# Patient Record
Sex: Male | Born: 1970 | Race: White | Hispanic: No | Marital: Married | Smoking: Never smoker
Health system: Southern US, Community
[De-identification: ages and names within clinical notes are randomized; demographics above are authoritative.]

## PROBLEM LIST (undated history)

## (undated) DIAGNOSIS — R6882 Decreased libido: Secondary | ICD-10-CM

## (undated) DIAGNOSIS — E559 Vitamin D deficiency, unspecified: Secondary | ICD-10-CM

## (undated) DIAGNOSIS — J309 Allergic rhinitis, unspecified: Secondary | ICD-10-CM

## (undated) DIAGNOSIS — R945 Abnormal results of liver function studies: Secondary | ICD-10-CM

## (undated) DIAGNOSIS — Z6829 Body mass index (BMI) 29.0-29.9, adult: Secondary | ICD-10-CM

## (undated) DIAGNOSIS — R7989 Other specified abnormal findings of blood chemistry: Secondary | ICD-10-CM

## (undated) DIAGNOSIS — L308 Other specified dermatitis: Secondary | ICD-10-CM

## (undated) DIAGNOSIS — E782 Mixed hyperlipidemia: Secondary | ICD-10-CM

## (undated) HISTORY — DX: Other specified dermatitis: L30.8

## (undated) HISTORY — PX: NO PAST SURGERIES: SHX2092

## (undated) HISTORY — DX: Abnormal results of liver function studies: R94.5

## (undated) HISTORY — DX: Body mass index (BMI) 29.0-29.9, adult: Z68.29

## (undated) HISTORY — DX: Vitamin D deficiency, unspecified: E55.9

## (undated) HISTORY — DX: Other specified abnormal findings of blood chemistry: R79.89

## (undated) HISTORY — DX: Mixed hyperlipidemia: E78.2

## (undated) HISTORY — DX: Decreased libido: R68.82

## (undated) HISTORY — DX: Allergic rhinitis, unspecified: J30.9

---

## 1998-12-18 ENCOUNTER — Emergency Department (HOSPITAL_COMMUNITY): Admission: EM | Admit: 1998-12-18 | Discharge: 1998-12-18 | Payer: Self-pay | Admitting: Emergency Medicine

## 1999-01-07 ENCOUNTER — Emergency Department (HOSPITAL_COMMUNITY): Admission: EM | Admit: 1999-01-07 | Discharge: 1999-01-07 | Payer: Self-pay | Admitting: Emergency Medicine

## 1999-11-27 ENCOUNTER — Emergency Department (HOSPITAL_COMMUNITY): Admission: EM | Admit: 1999-11-27 | Discharge: 1999-11-27 | Payer: Self-pay | Admitting: *Deleted

## 2000-03-26 ENCOUNTER — Emergency Department (HOSPITAL_COMMUNITY): Admission: EM | Admit: 2000-03-26 | Discharge: 2000-03-26 | Payer: Self-pay | Admitting: Emergency Medicine

## 2000-10-29 ENCOUNTER — Emergency Department (HOSPITAL_COMMUNITY): Admission: EM | Admit: 2000-10-29 | Discharge: 2000-10-30 | Payer: Self-pay | Admitting: Emergency Medicine

## 2000-10-30 ENCOUNTER — Emergency Department (HOSPITAL_COMMUNITY): Admission: EM | Admit: 2000-10-30 | Discharge: 2000-10-30 | Payer: Self-pay | Admitting: Emergency Medicine

## 2000-10-30 ENCOUNTER — Encounter: Payer: Self-pay | Admitting: Internal Medicine

## 2001-10-13 ENCOUNTER — Emergency Department (HOSPITAL_COMMUNITY): Admission: EM | Admit: 2001-10-13 | Discharge: 2001-10-14 | Payer: Self-pay | Admitting: Emergency Medicine

## 2001-10-14 ENCOUNTER — Encounter: Payer: Self-pay | Admitting: Emergency Medicine

## 2006-07-06 ENCOUNTER — Encounter: Admission: RE | Admit: 2006-07-06 | Discharge: 2006-07-06 | Payer: Self-pay | Admitting: Family Medicine

## 2006-08-10 ENCOUNTER — Emergency Department (HOSPITAL_COMMUNITY): Admission: EM | Admit: 2006-08-10 | Discharge: 2006-08-10 | Payer: Self-pay | Admitting: Family Medicine

## 2018-12-09 ENCOUNTER — Other Ambulatory Visit: Payer: Self-pay

## 2018-12-09 ENCOUNTER — Encounter: Payer: Self-pay | Admitting: Cardiology

## 2018-12-09 ENCOUNTER — Ambulatory Visit (INDEPENDENT_AMBULATORY_CARE_PROVIDER_SITE_OTHER): Payer: Commercial Managed Care - PPO | Admitting: Cardiology

## 2018-12-09 VITALS — BP 142/96 | HR 80 | Ht 67.0 in | Wt 202.0 lb

## 2018-12-09 DIAGNOSIS — R0789 Other chest pain: Secondary | ICD-10-CM | POA: Diagnosis not present

## 2018-12-09 DIAGNOSIS — E782 Mixed hyperlipidemia: Secondary | ICD-10-CM | POA: Diagnosis not present

## 2018-12-09 DIAGNOSIS — R079 Chest pain, unspecified: Secondary | ICD-10-CM | POA: Diagnosis not present

## 2018-12-09 DIAGNOSIS — I10 Essential (primary) hypertension: Secondary | ICD-10-CM | POA: Diagnosis not present

## 2018-12-09 NOTE — Progress Notes (Signed)
Cardiology Office Note:    Date:  12/09/2018   ID:  Jeremiah Cooley, DOB 1971-01-05, MRN [id number]  PCP:  Nicholos Johns, MD  Cardiologist:  Jenean Lindau, MD   Referring MD: Nicholos Johns, MD    ASSESSMENT:    1. Chest discomfort   2. Chest pain, unspecified type   3. Essential hypertension   4. Mixed dyslipidemia    PLAN:    In order of problems listed above:  1. Primary prevention stressed to the patient.  Importance of compliance with diet and medication stressed and he vocalized understanding. 2. His blood pressure is elevated.  He does not have a diagnosis of hypertension.  Lifestyle modification salt intake issues in the diet were discussed with him at extensive length and he promises to do better. 3. Mixed dyslipidemia: He is going to diet and get his lipids lower.  We will recheck them subsequently. 4. Chest discomfort: His symptoms are atypical for coronary etiology but in view of risk factors we will do a Lexiscan sestamibi.  Calcium score CT scoring will be done to assess risk stratification. 5. Patient will be seen in follow-up appointment in 2 months or earlier if the patient has any concerns    Medication Adjustments/Labs and Tests Ordered: Current medicines are reviewed at length with the patient today.  Concerns regarding medicines are outlined above.  No orders of the defined types were placed in this encounter.  No orders of the defined types were placed in this encounter.    History of Present Illness:    Jeremiah Cooley is a 48 y.o. male who is being seen today for the evaluation of chest discomfort at the request of Nicholos Johns, MD.  Patient is a pleasant 48 year old male.  He has past medical history of essential hypertension and mixed dyslipidemia.  He is on statins and elevated LFTs and so he stopped it.  He mentions to me that occasionally has chest discomfort and he talked about it to his primary care physician and therefore he was sent here for  evaluation.  The past week or so he has had no such problems.  His chest discomfort is not related to exertion.  No radiation or radiation to any part of the body.  At the time of my evaluation, the patient is alert awake oriented and in no distress.  Past Medical History:  Diagnosis Date   Abnormal liver function test    BMI 29.0-29.9,adult    Decreased libido    Excoriated eczema    Mixed hyperlipidemia    Rhinitis, allergic    Vitamin D deficiency     Past Surgical History:  Procedure Laterality Date   NO PAST SURGERIES      Current Medications: Current Meds  Medication Sig   aspirin EC 81 MG tablet Take 81 mg by mouth daily.   testosterone (ANDROGEL) 50 MG/5GM (1%) GEL Place 5 g onto the skin daily.   vitamin B-12 (CYANOCOBALAMIN) 100 MCG tablet Take 100 mcg by mouth daily.     Allergies:   Patient has no known allergies.   Social History   Socioeconomic History   Marital status: Married    Spouse name: Not on file   Number of children: Not on file   Years of education: Not on file   Highest education level: Not on file  Occupational History   Not on file  Social Needs   Financial resource strain: Not on file   Food insecurity  Worry: Not on file    Inability: Not on file   Transportation needs    Medical: Not on file    Non-medical: Not on file  Tobacco Use   Smoking status: Never Smoker   Smokeless tobacco: Never Used  Substance and Sexual Activity   Alcohol use: Not Currently   Drug use: Never   Sexual activity: Not on file  Lifestyle   Physical activity    Days per week: Not on file    Minutes per session: Not on file   Stress: Not on file  Relationships   Social connections    Talks on phone: Not on file    Gets together: Not on file    Attends religious service: Not on file    Active member of club or organization: Not on file    Attends meetings of clubs or organizations: Not on file    Relationship status: Not  on file  Other Topics Concern   Not on file  Social History Narrative   Not on file     Family History: The patient's family history includes Asthma in his father; Diabetes in his mother; Heart disease in his mother; Hypertension in his father and sister; Stroke in his father.  ROS:   Please see the history of present illness.    All other systems reviewed and are negative.  EKGs/Labs/Other Studies Reviewed:    The following studies were reviewed today: EKG reveals sinus rhythm and nonspecific ST-T changes.   Recent Labs: No results found for requested labs within last 8760 hours.  Recent Lipid Panel No results found for: CHOL, TRIG, HDL, CHOLHDL, VLDL, LDLCALC, LDLDIRECT  Physical Exam:    VS:  BP (!) 142/96 (BP Location: Right Arm, Patient Position: Sitting, Cuff Size: Normal)    Pulse 80    Ht 5\' 7"  (1.702 m)    Wt 202 lb (91.6 kg)    SpO2 98%    BMI 31.64 kg/m     Wt Readings from Last 3 Encounters:  12/09/18 202 lb (91.6 kg)     GEN: Patient is in no acute distress HEENT: Normal NECK: No JVD; No carotid bruits LYMPHATICS: No lymphadenopathy CARDIAC: S1 S2 regular, 2/6 systolic murmur at the apex. RESPIRATORY:  Clear to auscultation without rales, wheezing or rhonchi  ABDOMEN: Soft, non-tender, non-distended MUSCULOSKELETAL:  No edema; No deformity  SKIN: Warm and dry NEUROLOGIC:  Alert and oriented x 3 PSYCHIATRIC:  Normal affect    Signed, 13/02/20, MD  12/09/2018 4:37 PM    [facility]

## 2018-12-09 NOTE — Patient Instructions (Addendum)
Medication Instructions:  Your physician recommends that you continue on your current medications as directed. Please refer to the Current Medication list given to you today.   *If you need a refill on your cardiac medications before your next appointment, please call your pharmacy*  Lab Work: NONE If you have labs (blood work) drawn today and your tests are completely normal, you will receive your results only by: Jeremiah Cooley MyChart Message (if you have MyChart) OR . A paper copy in the mail If you have any lab test that is abnormal or we need to change your treatment, we will call you to review the results.  Testing/Procedures: You had an EKG performed today  Your physician has requested that you have a lexiscan myoview. For further information please visit [email or web address]. Please follow instruction sheet, as given.  Your physician has requested that you have a CT calcium score performed. THERE IS A $150 fee due upon time of service. You have been scheduled for Nov.20, 2020 at 1:30 PM at [address], [place], [place]    Follow-Up: At [facility], you and your health needs are our priority.  As part of our continuing mission to provide you with exceptional heart care, we have created designated Provider Care Teams.  These Care Teams include your primary Cardiologist (physician) and Advanced Practice Providers (APPs -  Physician Assistants and Nurse Practitioners) who all work together to provide you with the care you need, when you need it.  Your next appointment:   2 months  The format for your next appointment:   In Person  Provider:   Jyl Heinz, MD  Other Instructions Regadenoson injection What is this medicine? REGADENOSON is used to test the heart for coronary artery disease. It is used in patients who can not exercise for their stress test. This medicine may be used for other purposes; ask your health care provider or pharmacist if you have questions.  COMMON BRAND NAME(S): Lexiscan What should I tell my health care provider before I take this medicine? They need to know if you have any of these conditions:  heart problems  lung or breathing disease, like asthma or COPD  an unusual or allergic reaction to regadenoson, other medicines, foods, dyes, or preservatives  pregnant or trying to get pregnant  breast-feeding How should I use this medicine? This medicine is for injection into a vein. It is given by a health care professional in a hospital or clinic setting. Talk to your pediatrician regarding the use of this medicine in children. Special care may be needed. Overdosage: If you think you have taken too much of this medicine contact a poison control center or emergency room at once. NOTE: This medicine is only for you. Do not share this medicine with others. What if I miss a dose? This does not apply. What may interact with this medicine?  caffeine  dipyridamole  guarana  theophylline This list may not describe all possible interactions. Give your health care provider a list of all the medicines, herbs, non-prescription drugs, or dietary supplements you use. Also tell them if you smoke, drink alcohol, or use illegal drugs. Some items may interact with your medicine. What should I watch for while using this medicine? Your condition will be monitored carefully while you are receiving this medicine. Do not take medicines, foods, or drinks with caffeine (like coffee, tea, or colas) for at least 12 hours before your test. If you do not know if something contains caffeine,  ask your health care professional. What side effects may I notice from receiving this medicine? Side effects that you should report to your doctor or health care professional as soon as possible:  allergic reactions like skin rash, itching or hives, swelling of the face, lips, or tongue  breathing problems  chest pain, tightness or palpitations  severe  headache Side effects that usually do not require medical attention (report to your doctor or health care professional if they continue or are bothersome):  flushing  headache  irritation or pain at site where injected  nausea, vomiting This list may not describe all possible side effects. Call your doctor for medical advice about side effects. You may report side effects to FDA at 1-800-FDA-1088. Where should I keep my medicine? This drug is given in a hospital or clinic and will not be stored at home. NOTE: This sheet is a summary. It may not cover all possible information. If you have questions about this medicine, talk to your doctor, pharmacist, or health care provider.  2020 Elsevier/Gold Standard (2007-09-23 15:08:13)  Cardiac Nuclear Scan A cardiac nuclear scan is a test that is done to check the flow of blood to your heart. It is done when you are resting and when you are exercising. The test looks for problems such as:  Not enough blood reaching a portion of the heart.  The heart muscle not working as it should. You may need this test if:  You have heart disease.  You have had lab results that are not normal.  You have had heart surgery or a balloon procedure to open up blocked arteries (angioplasty).  You have chest pain.  You have shortness of breath. In this test, a special dye (tracer) is put into your bloodstream. The tracer will travel to your heart. A camera will then take pictures of your heart to see how the tracer moves through your heart. This test is usually done at a hospital and takes 2-4 hours. Tell a doctor about:  Any allergies you have.  All medicines you are taking, including vitamins, herbs, eye drops, creams, and over-the-counter medicines.  Any problems you or family members have had with anesthetic medicines.  Any blood disorders you have.  Any surgeries you have had.  Any medical conditions you have.  Whether you are pregnant or may  be pregnant. What are the risks? Generally, this is a safe test. However, problems may occur, such as:  Serious chest pain and heart attack. This is only a risk if the stress portion of the test is done.  Rapid heartbeat.  A feeling of warmth in your chest. This feeling usually does not last long.  Allergic reaction to the tracer. What happens before the test?  Ask your doctor about changing or stopping your normal medicines. This is important.  Follow instructions from your doctor about what you cannot eat or drink.  Remove your jewelry on the day of the test. What happens during the test?  An IV tube will be inserted into one of your veins.  Your doctor will give you a small amount of tracer through the IV tube.  You will wait for 20-40 minutes while the tracer moves through your bloodstream.  Your heart will be monitored with an electrocardiogram (ECG).  You will lie down on an exam table.  Pictures of your heart will be taken for about 15-20 minutes.  You may also have a stress test. For this test, one of these things  may be done: ? You will be asked to exercise on a treadmill or a stationary bike. ? You will be given medicines that will make your heart work harder. This is done if you are unable to exercise.  When blood flow to your heart has peaked, a tracer will again be given through the IV tube.  After 20-40 minutes, you will get back on the exam table. More pictures will be taken of your heart.  Depending on the tracer that is used, more pictures may need to be taken 3-4 hours later.  Your IV tube will be removed when the test is over. The test may vary among doctors and hospitals. What happens after the test?  Ask your doctor: ? Whether you can return to your normal schedule, including diet, activities, and medicines. ? Whether you should drink more fluids. This will help to remove the tracer from your body. Drink enough fluid to keep your pee (urine) pale  yellow.  Ask your doctor, or the department that is doing the test: ? When will my results be ready? ? How will I get my results? Summary  A cardiac nuclear scan is a test that is done to check the flow of blood to your heart.  Tell your doctor whether you are pregnant or may be pregnant.  Before the test, ask your doctor about changing or stopping your normal medicines. This is important.  Ask your doctor whether you can return to your normal activities. You may be asked to drink more fluids. This information is not intended to replace advice given to you by your health care provider. Make sure you discuss any questions you have with your health care provider. Document Released: 07/09/2017 Document Revised: 05/15/2018 Document Reviewed: 07/09/2017 Elsevier Patient Education  2020 Elsevier Inc.   Coronary Calcium Scan A coronary calcium scan is an imaging test used to look for deposits of calcium and other fatty materials (plaques) in the inner lining of the blood vessels of the heart (coronary arteries). These deposits of calcium and plaques can partly clog and narrow the coronary arteries without producing any symptoms or warning signs. This puts a person at risk for a heart attack. This test can detect these deposits before symptoms develop. Tell a health care provider about:  Any allergies you have.  All medicines you are taking, including vitamins, herbs, eye drops, creams, and over-the-counter medicines.  Any problems you or family members have had with anesthetic medicines.  Any blood disorders you have.  Any surgeries you have had.  Any medical conditions you have.  Whether you are pregnant or may be pregnant. What are the risks? Generally, this is a safe procedure. However, problems may occur, including:  Harm to a pregnant woman and her unborn baby. This test involves the use of radiation. Radiation exposure can be dangerous to a pregnant woman and her unborn baby.  If you are pregnant, you generally should not have this procedure done.  Slight increase in the risk of cancer. This is because of the radiation involved in the test. What happens before the procedure? No preparation is needed for this procedure. What happens during the procedure?   You will undress and remove any jewelry around your neck or chest.  You will put on a hospital gown.  Sticky electrodes will be placed on your chest. The electrodes will be connected to an electrocardiogram (ECG) machine to record a tracing of the electrical activity of your heart.  A CT scanner  will take pictures of your heart. During this time, you will be asked to lie still and hold your breath for 2-3 seconds while a picture of your heart is being taken. The procedure may vary among health care providers and hospitals. What happens after the procedure?  You can get dressed.  You can return to your normal activities.  It is up to you to get the results of your test. Ask your health care provider, or the department that is doing the test, when your results will be ready. Summary  A coronary calcium scan is an imaging test used to look for deposits of calcium and other fatty materials (plaques) in the inner lining of the blood vessels of the heart (coronary arteries).  Generally, this is a safe procedure. Tell your health care provider if you are pregnant or may be pregnant.  No preparation is needed for this procedure.  A CT scanner will take pictures of your heart.  You can return to your normal activities after the scan is done. This information is not intended to replace advice given to you by your health care provider. Make sure you discuss any questions you have with your health care provider. Document Released: 07/22/2007 Document Revised: 01/05/2017 Document Reviewed: 12/13/2015 Elsevier Patient Education  2020 [employer].

## 2018-12-18 ENCOUNTER — Telehealth (HOSPITAL_COMMUNITY): Payer: Self-pay | Admitting: *Deleted

## 2018-12-18 NOTE — Telephone Encounter (Signed)
Patient given detailed instructions per Myocardial Perfusion Study Information Sheet for the test on 12/25/18. Patient notified to arrive 15 minutes early and that it is imperative to arrive on time for appointment to keep from having the test rescheduled.  If you need to cancel or reschedule your appointment, please call the office within 24 hours of your appointment. . Patient verbalized understanding. Aneliz Carbary Jacqueline    

## 2018-12-27 ENCOUNTER — Other Ambulatory Visit: Payer: Commercial Managed Care - PPO

## 2019-01-15 ENCOUNTER — Telehealth: Payer: Self-pay | Admitting: *Deleted

## 2019-01-15 ENCOUNTER — Encounter: Payer: Self-pay | Admitting: *Deleted

## 2019-01-15 NOTE — Telephone Encounter (Signed)
Patient given detailed instructions per Myocardial Perfusion Study Information Sheet for the test on 01/23/2019 at 0815. Patient notified to arrive 15 minutes early and that it is imperative to arrive on time for appointment to keep from having the test rescheduled.  If you need to cancel or reschedule your appointment, please call the office within 24 hours of your appointment. . Patient verbalized understanding.[place] Mychart letter sent with instructions

## 2019-01-24 ENCOUNTER — Inpatient Hospital Stay: Admission: RE | Admit: 2019-01-24 | Payer: Commercial Managed Care - PPO | Source: Ambulatory Visit

## 2023-06-15 ENCOUNTER — Other Ambulatory Visit: Payer: Self-pay | Admitting: Otolaryngology

## 2023-06-15 DIAGNOSIS — J329 Chronic sinusitis, unspecified: Secondary | ICD-10-CM

## 2023-06-20 ENCOUNTER — Other Ambulatory Visit

## 2023-06-27 ENCOUNTER — Ambulatory Visit
Admission: RE | Admit: 2023-06-27 | Discharge: 2023-06-27 | Disposition: A | Discharge status: Home / Self Care | Source: Ambulatory Visit | Attending: Otolaryngology | Admitting: Otolaryngology

## 2023-06-27 DIAGNOSIS — J329 Chronic sinusitis, unspecified: Secondary | ICD-10-CM

## 2024-02-18 ENCOUNTER — Other Ambulatory Visit: Payer: Self-pay | Admitting: Otolaryngology

## 2024-02-18 DIAGNOSIS — J338 Other polyp of sinus: Secondary | ICD-10-CM

## 2024-02-21 ENCOUNTER — Inpatient Hospital Stay: Admission: RE | Admit: 2024-02-21 | Discharge: 2024-02-21 | Attending: Otolaryngology | Admitting: Otolaryngology

## 2024-02-21 DIAGNOSIS — J338 Other polyp of sinus: Secondary | ICD-10-CM
